# Patient Record
Sex: Male | Born: 1990 | Race: Black or African American | Hispanic: No | Marital: Single | Smoking: Never smoker
Health system: Southern US, Community
[De-identification: ages and names within clinical notes are randomized; demographics above are authoritative.]

---
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
## 2022-03-27 ENCOUNTER — Ambulatory Visit
Admission: EM | Admit: 2022-03-27 | Discharge: 2022-03-27 | Disposition: A | Discharge status: Home / Self Care | Payer: 59 | Attending: Family Medicine | Admitting: Family Medicine

## 2022-03-27 DIAGNOSIS — J40 Bronchitis, not specified as acute or chronic: Secondary | ICD-10-CM | POA: Insufficient documentation

## 2022-03-27 DIAGNOSIS — J329 Chronic sinusitis, unspecified: Secondary | ICD-10-CM | POA: Diagnosis not present

## 2022-03-27 LAB — GROUP A STREP BY PCR: Group A Strep by PCR: NOT DETECTED

## 2022-03-27 MED ORDER — PREDNISONE 20 MG PO TABS: 40.0000 mg | ORAL_TABLET | Freq: Every day | ORAL | 0 refills | Status: AC

## 2022-03-27 MED ORDER — AZITHROMYCIN 250 MG PO TABS: 250.0000 mg | ORAL_TABLET | Freq: Every day | ORAL | 0 refills | Status: DC

## 2022-03-27 NOTE — ED Triage Notes (Signed)
Pt states that he has a cough, sore throat, and chest congestion. X1 week

## 2022-03-27 NOTE — Discharge Instructions (Addendum)
Stop by the pharmacy to pick up your prescriptions.  Follow up with your primary care provider as needed.  

## 2022-03-27 NOTE — ED Provider Notes (Signed)
[facility]    CSN: [id number] Arrival date & time: 03/27/22  1359      History   Chief Complaint Chief Complaint  Patient presents with   Cough    Cough, sore throat, and chest congestion. X1 week    HPI Thomas Barajas is a 31 y.o. male.   HPI   [name] presents for productive cough for the past week. Says his throat hurts, ears feel clogged.  He took some Cold and Flu pills that helped somewhat. His symptoms are not improving. His male friend was sick with similar sx but she got better quicker than he did. He has no shortness of breath but has chest discomfort when he coughs.    Fever : no Chills: no Sore throat: yes Cough: yes Sputum: yes Nasal congestion : yes Rhinorrhea: no Myalgias: no Appetite: normal  Hydration: normal  Abdominal pain: no Nausea: no Vomiting: no Diarrhea: No Rash: No Sleep disturbance: no Headache: no      History reviewed. No pertinent past medical history.  There are no problems to display for this patient.   History reviewed. No pertinent surgical history.     Home Medications    Prior to Admission medications   Not on File    Family History History reviewed. No pertinent family history.  Social History Social History   Tobacco Use   Smoking status: Never   Smokeless tobacco: Never  Substance Use Topics   Alcohol use: Never   Drug use: Never     Allergies   Penicillins   Review of Systems Review of Systems: negative unless otherwise stated in HPI.      Physical Exam Triage Vital Signs ED Triage Vitals  Enc Vitals Group     BP 03/27/22 1437 117/79     Pulse Rate 03/27/22 1437 (!) 58     Resp 03/27/22 1437 20     Temp 03/27/22 1437 98.4 F (36.9 C)     Temp Source 03/27/22 1437 Oral     SpO2 03/27/22 1437 99 %     Weight 03/27/22 1435 175 lb (79.4 kg)     Height 03/27/22 1435 6\' 2"  (1.88 m)     Head Circumference --      Peak Flow --      Pain Score 03/27/22 1435 5      Pain Loc --      Pain Edu? --      Excl. in [place]? --    No data found.  Updated Vital Signs BP 117/79 (BP Location: Left Arm)   Pulse (!) 58   Temp 98.4 F (36.9 C) (Oral)   Resp 20   Ht 6\' 2"  (1.88 m)   Wt 79.4 kg   SpO2 99%   BMI 22.47 kg/m   Visual Acuity Right Eye Distance:   Left Eye Distance:   Bilateral Distance:    Right Eye Near:   Left Eye Near:    Bilateral Near:     Physical Exam GEN:     alert, non-toxic appearing male in no distress ***   HENT:  mucus membranes moist, oropharyngeal ***without lesions or ***exudate, no*** tonsillar hypertrophy, *** mild oropharyngeal erythema , *** moderate erythematous edematous turbinates, ***clear nasal discharge, ***bilateral TM ***normal EYES:   pupils equal and reactive, EOMi***, ***no scleral injection NECK:  normal ROM, no ***lymphadenopathy, ***no meningismus   RESP:  no increased work of breathing, ***clear to auscultation bilaterally CVS:   regular rate ***and rhythm  Skin:   warm and dry, no rash on visible skin***, normal*** skin turgor    UC Treatments / Results  Labs (all labs ordered are listed, but only abnormal results are displayed) Labs Reviewed  GROUP A STREP BY PCR    EKG   Radiology No results found.  Procedures Procedures (including critical care time)  Medications Ordered in UC Medications - No data to display  Initial Impression / Assessment and Plan / UC Course  I have reviewed the triage vital signs and the nursing notes.  Pertinent labs & imaging results that were available during my care of the patient were reviewed by me and considered in my medical decision making (see chart for details).       Pt is a 31 y.o. male who presents for *** days of respiratory symptoms. Ladarian is ***afebrile here without recent antipyretics. Satting well on room air. Overall pt is ***well appearing, well hydrated, without respiratory distress. Pulmonary exam ***is unremarkable.  COVID ***and  influenza testing obtained. ***Pt to quarantine until COVID test results or longer if positive.  I will call patient with test results, if positive. History consistent with ***viral respiratory illness. Discussed symptomatic treatment.  Explained lack of efficacy of antibiotics in viral disease.  Typical duration of symptoms discussed. ***Return and ED precautions given and patient/parent*** voiced understanding. - continue age-appropriate Tylenol/ ***Motrin as needed for discomfort/fever - nasal saline to help with his nasal congestion - Use a mist humidifier to help with breathing - Stressed importance of hydration  - Refilled ***allergy medication/ Flonase - Albuterol to help with chest tightness ***  - Zofran for nausea *** - Work/***School note provided, per pt request  - Discussed return and ED precautions, understanding voiced.   Discussed MDM, treatment plan and plan for follow-up with patient/parent*** who agrees with plan.     Final Clinical Impressions(s) / UC Diagnoses   Final diagnoses:  None   Discharge Instructions   None    ED Prescriptions   None    PDMP not reviewed this encounter.

## 2022-10-29 ENCOUNTER — Ambulatory Visit: Payer: Self-pay

## 2022-11-01 ENCOUNTER — Ambulatory Visit: Payer: Self-pay | Admitting: Family Medicine

## 2022-11-01 DIAGNOSIS — Z113 Encounter for screening for infections with a predominantly sexual mode of transmission: Secondary | ICD-10-CM

## 2022-11-01 DIAGNOSIS — Z708 Other sex counseling: Secondary | ICD-10-CM

## 2022-11-01 DIAGNOSIS — Z202 Contact with and (suspected) exposure to infections with a predominantly sexual mode of transmission: Secondary | ICD-10-CM

## 2022-11-01 MED ORDER — METRONIDAZOLE 500 MG PO TABS
500.0000 mg | ORAL_TABLET | Freq: Two times a day (BID) | ORAL | 0 refills | Status: AC
Start: 2022-11-01 — End: 2022-11-08

## 2022-11-01 NOTE — Progress Notes (Addendum)
Pt stated he was a contact to trich.  The patient was dispensed metronidazole 500mg  today. I provided counseling today regarding the medication. We discussed the medication, the side effects and when to call clinic. Patient given the opportunity to ask questions. Questions answered.  Gaspar Garbe, RN

## 2022-11-01 NOTE — Progress Notes (Signed)
[place] [facility] STI clinic/screening visit  Subjective:  Thomas Barajas is a 32 y.o. male being seen today for an [facility] The patient reports they do not have symptoms.    Patient has the following medical conditions:  There are no problems to display for this patient.   No chief complaint on file.   Last HIV test per patient/review of record was No results found for: "HMHIVSCREEN" No results found for: "HIV"  Does the patient or their partner desires a pregnancy in the next year? Yes  Screening for MPX risk: Does the patient have an unexplained rash? No Is the patient MSM? No Does the patient endorse multiple sex partners or anonymous sex partners? No Did the patient have close or sexual contact with a person diagnosed with MPX? No Has the patient traveled outside the [place] where MPX is endemic? No Is there a high clinical suspicion for MPX-- evidenced by one of the following No  -Unlikely to be chickenpox  -Lymphadenopathy  -Rash that present in same phase of evolution on any given body part   See flowsheet for further details and programmatic requirements.    There is no immunization history on file for this patient.   The following portions of the patient's history were reviewed and updated as appropriate: allergies, current medications, past medical history, past social history, past surgical history and problem list.  Objective:  There were no vitals filed for this visit.  Patient seen by RN only. Self collected samples.  Assessment and Plan:  Thomas Barajas is a 32 y.o. male presenting to the [facility] for STI screening in [facility].  There are no diagnoses linked to this encounter.  Patient does not have STI symptoms Patient accepted all screenings including  urine GC/Chlamydia, and blood work for HIV/Syphilis. Patient meets criteria for HepB screening? Yes. Ordered? No, pt  declined  Patient meets criteria for HepC screening? Yes. Ordered? No, pt declined Recommended condom use with all sex Discussed importance of condom use for STI prevent  Treat positive test results per standing order. Discussed time line for State Lab results and that patient will be called with positive results and encouraged patient to call if he had not heard in 2 weeks Recommended repeat testing in 3 months with positive results.  No follow-ups on file.  Future Appointments  Date Time Provider Department Center  11/01/2022  1:20 PM Lenice Llamas, FNP [facility] None    Gaspar Garbe, RN

## 2022-11-03 NOTE — Progress Notes (Signed)
Attestation of [facility]: Evaluation and management procedures were performed by the Registered Nurse under [facility] standing orders.  RN independently saw the patient and provided screenings for them without medical exam. Patient self collected specimens. I have reviewed the RN's note and chart, and I agree with screening ordered.   Gavina Dildine, FNP  

## 2022-11-04 LAB — CHLAMYDIA/GC NAA, CONFIRMATION
Chlamydia trachomatis, NAA: NEGATIVE
Neisseria gonorrhoeae, NAA: NEGATIVE

## 2023-10-30 ENCOUNTER — Other Ambulatory Visit: Payer: Self-pay

## 2023-10-30 ENCOUNTER — Emergency Department
Admission: EM | Admit: 2023-10-30 | Discharge: 2023-10-30 | Disposition: A | Discharge status: Home / Self Care | Payer: Self-pay | Attending: Emergency Medicine | Admitting: Emergency Medicine

## 2023-10-30 ENCOUNTER — Encounter: Payer: Self-pay | Admitting: Emergency Medicine

## 2023-10-30 DIAGNOSIS — H5712 Ocular pain, left eye: Secondary | ICD-10-CM | POA: Diagnosis present

## 2023-10-30 DIAGNOSIS — H11422 Conjunctival edema, left eye: Secondary | ICD-10-CM | POA: Insufficient documentation

## 2023-10-30 DIAGNOSIS — X58XXXA Exposure to other specified factors, initial encounter: Secondary | ICD-10-CM | POA: Insufficient documentation

## 2023-10-30 DIAGNOSIS — S0502XA Injury of conjunctiva and corneal abrasion without foreign body, left eye, initial encounter: Secondary | ICD-10-CM | POA: Insufficient documentation

## 2023-10-30 MED ORDER — FLUORESCEIN SODIUM 1 MG OP STRP
1.0000 | ORAL_STRIP | Freq: Once | OPHTHALMIC | Status: AC
  Administered 2023-10-30: 1 via OPHTHALMIC
  Filled 2023-10-30: qty 1

## 2023-10-30 MED ORDER — KETOROLAC TROMETHAMINE 0.5 % OP SOLN
1.0000 [drp] | Freq: Four times a day (QID) | OPHTHALMIC | 0 refills | Status: AC
Start: 2023-10-30 — End: ?

## 2023-10-30 MED ORDER — ERYTHROMYCIN 5 MG/GM OP OINT
TOPICAL_OINTMENT | Freq: Four times a day (QID) | OPHTHALMIC | Status: DC
  Administered 2023-10-30: 1 via OPHTHALMIC
  Filled 2023-10-30: qty 1

## 2023-10-30 MED ORDER — GENTAMICIN SULFATE 0.3 % OP SOLN: 1.0000 [drp] | Freq: Three times a day (TID) | OPHTHALMIC | 0 refills | Status: AC

## 2023-10-30 MED ORDER — TETRACAINE HCL 0.5 % OP SOLN
2.0000 [drp] | Freq: Once | OPHTHALMIC | Status: AC
  Administered 2023-10-30: 2 [drp] via OPHTHALMIC
  Filled 2023-10-30: qty 4

## 2023-10-30 NOTE — ED Provider Notes (Signed)
 [facility] Emergency Department Provider Note     Event Date/Time   First MD Initiated Contact with Patient 10/30/23 1345     (approximate)   History   Eye Pain   HPI  Thomas Barajas is a 33 y.o. male with a noncontributory medical history, presents to the ED endorsing left eye pain and irritation.  Patient reports he had just gone to the trash to pick up and out of the dumpster, and when he came back he had a irritation to the eye that caused him to rub the eye profusely.  He continued to endorse eye irritation and rubbing.  He went to the bathroom to look at his eye, and noticed some swelling to the lateral aspect over the conjunctiva that he described as a bubble.  Patient denies any current visual change, nausea, vomiting, or vision loss.  He would endorse some mild headache pain at this time.  Patient denies a gross foreign body sensation to the eye.  Physical Exam   Triage Vital Signs: ED Triage Vitals  Encounter Vitals Group     BP 10/30/23 1339 (!) 114/48     Girls Systolic BP Percentile --      Girls Diastolic BP Percentile --      Boys Systolic BP Percentile --      Boys Diastolic BP Percentile --      Pulse Rate 10/30/23 1339 78     Resp 10/30/23 1339 14     Temp 10/30/23 1339 98.4 F (36.9 C)     Temp Source 10/30/23 1339 Oral     SpO2 10/30/23 1339 100 %     Weight 10/30/23 1340 170 lb (77.1 kg)     Height 10/30/23 1340 6' 2 (1.88 m)     Head Circumference --      Peak Flow --      Pain Score 10/30/23 1339 10     Pain Loc --      Pain Education --      Exclude from Growth Chart --     Most recent vital signs: Vitals:   10/30/23 1339  BP: (!) 114/48  Pulse: 78  Resp: 14  Temp: 98.4 F (36.9 C)  SpO2: 100%    General Awake, no distress. NAD HEENT NCAT. PERRL. EOMI. left eye with lateral conjunctiva edema noted.  No gross foreign body appreciated with evaluation or upper lid eversion.  Fluorescein dye uptake to the  lateral conjunctiva at 9 o'clock position consistent with a small corneal abrasion.  No rhinorrhea. Mucous membranes are moist.  CV:  Good peripheral perfusion.  RESP:  Normal effort.    ED Results / Procedures / Treatments   Labs (all labs ordered are listed, but only abnormal results are displayed) Labs Reviewed - No data to display   EKG   RADIOLOGY   PROCEDURES:  Critical Care performed: No  Procedures   MEDICATIONS ORDERED IN ED: Medications  erythromycin ophthalmic ointment (1 Application Left Eye Given 10/30/23 1424)  tetracaine (PONTOCAINE) 0.5 % ophthalmic solution 2 drop (2 drops Left Eye Given by Other 10/30/23 1430)  fluorescein ophthalmic strip 1 strip (1 strip Left Eye Given by Other 10/30/23 1430)     IMPRESSION / MDM / ASSESSMENT AND PLAN / ED COURSE  I reviewed the triage vital signs and the nursing notes.  Differential diagnosis includes, but is not limited to, corneal abrasion, corneal ulcer, corneal foreign body, conjunctivitis, chemosis, blepharitis  Patient's presentation is most consistent with acute, uncomplicated illness.  Patient's diagnosis is consistent with left corneal abrasion and mild conjunctival edema.  Patient presents with local eye irritation and local conjunctival edema likely related to irritant conjunctivitis.  Patient exam is stable at this time without evidence of a gross retained foreign body.  Will treat his corneal abrasion empirically with a started course of erythromycin ointment.  Patient will be discharged home with prescriptions for Garamycin and Acular.  Patient is given a soft eye patch latch cup as requested.  Patient is to follow up with [facility] as discussed, as needed or otherwise directed. Patient is given ED precautions to return to the ED for any worsening or new symptoms.   FINAL CLINICAL IMPRESSION(S) / ED DIAGNOSES   Final diagnoses:  Abrasion of left cornea, initial  encounter  Conjunctival edema of left eye     Rx / DC Orders   ED Discharge Orders          Ordered    ketorolac (ACULAR) 0.5 % ophthalmic solution  4 times daily        10/30/23 1407    gentamicin (GARAMYCIN) 0.3 % ophthalmic solution  3 times daily        10/30/23 1407             Note:  This document was prepared using Dragon voice recognition software and may include unintentional dictation errors.    Loyd Candida LULLA Aldona, PA-C 10/30/23 1535    Suzanne Kirsch, MD 10/30/23 [phone]

## 2023-10-30 NOTE — ED Triage Notes (Signed)
 Pt c/o left eye pain rated 10/10, no visual changes. He notes that he also has facial pain across forehead and down nose. No significant swelling or redness noted to eye or face.

## 2023-10-30 NOTE — Discharge Instructions (Addendum)
 Use the antibiotic eyedrop and anti-inflammatory eyedrop as prescribed.  Follow-up with [facility] for ongoing evaluation.  Return to the ED if needed.
